# Patient Record
Sex: Female | Born: 2000
Health system: Southern US, Community
[De-identification: ages and names within clinical notes are randomized; demographics above are authoritative.]

## PROBLEM LIST (undated history)

## (undated) DIAGNOSIS — F419 Anxiety disorder, unspecified: Secondary | ICD-10-CM

## (undated) DIAGNOSIS — G43909 Migraine, unspecified, not intractable, without status migrainosus: Secondary | ICD-10-CM

## (undated) HISTORY — DX: Anxiety disorder, unspecified: F41.9

## (undated) HISTORY — DX: Migraine, unspecified, not intractable, without status migrainosus: G43.909

---
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
## 2018-02-21 DIAGNOSIS — L709 Acne, unspecified: Secondary | ICD-10-CM | POA: Diagnosis not present

## 2018-02-21 DIAGNOSIS — B070 Plantar wart: Secondary | ICD-10-CM | POA: Diagnosis not present

## 2018-03-14 ENCOUNTER — Ambulatory Visit: Payer: 59 | Admitting: Family Medicine

## 2018-03-14 ENCOUNTER — Encounter: Payer: Self-pay | Admitting: Family Medicine

## 2018-03-14 VITALS — BP 108/64 | HR 72 | Temp 98.4°F | Ht 62.0 in | Wt 102.2 lb

## 2018-03-14 DIAGNOSIS — R5383 Other fatigue: Secondary | ICD-10-CM

## 2018-03-14 DIAGNOSIS — Z13220 Encounter for screening for lipoid disorders: Secondary | ICD-10-CM | POA: Diagnosis not present

## 2018-03-14 DIAGNOSIS — F419 Anxiety disorder, unspecified: Secondary | ICD-10-CM | POA: Diagnosis not present

## 2018-03-14 LAB — CBC WITH DIFFERENTIAL/PLATELET
BASOS ABS: 0.1 10*3/uL (ref 0.0–0.1)
BASOS PCT: 1 % (ref 0.0–3.0)
EOS ABS: 0.2 10*3/uL (ref 0.0–0.7)
Eosinophils Relative: 3.8 % (ref 0.0–5.0)
HCT: 38.3 % (ref 36.0–49.0)
HEMOGLOBIN: 13.1 g/dL (ref 12.0–16.0)
Lymphocytes Relative: 40.2 % (ref 24.0–48.0)
Lymphs Abs: 2.2 10*3/uL (ref 0.7–4.0)
MCHC: 34.3 g/dL (ref 31.0–37.0)
MCV: 88.8 fl (ref 78.0–98.0)
MONO ABS: 0.5 10*3/uL (ref 0.1–1.0)
Monocytes Relative: 8.9 % (ref 3.0–12.0)
Neutro Abs: 2.5 10*3/uL (ref 1.4–7.7)
Neutrophils Relative %: 46.1 % (ref 43.0–71.0)
PLATELETS: 244 10*3/uL (ref 150.0–575.0)
RBC: 4.31 Mil/uL (ref 3.80–5.70)
RDW: 12 % (ref 11.4–15.5)
WBC: 5.4 10*3/uL (ref 4.5–13.5)

## 2018-03-14 LAB — COMPREHENSIVE METABOLIC PANEL
ALBUMIN: 4.3 g/dL (ref 3.5–5.2)
ALT: 10 U/L (ref 0–35)
AST: 13 U/L (ref 0–37)
Alkaline Phosphatase: 39 U/L — ABNORMAL LOW (ref 47–119)
BUN: 17 mg/dL (ref 6–23)
CO2: 25 meq/L (ref 19–32)
CREATININE: 0.62 mg/dL (ref 0.40–1.20)
Calcium: 9.2 mg/dL (ref 8.4–10.5)
Chloride: 106 mEq/L (ref 96–112)
GFR: 131.25 mL/min (ref >60.00)
Glucose, Bld: 93 mg/dL (ref 70–99)
Potassium: 4.2 mEq/L (ref 3.5–5.1)
SODIUM: 139 meq/L (ref 135–145)
Total Bilirubin: 0.3 mg/dL (ref 0.2–1.2)
Total Protein: 6.8 g/dL (ref 6.0–8.3)

## 2018-03-14 LAB — LIPID PANEL
CHOL/HDL RATIO: 2
Cholesterol: 145 mg/dL (ref 0–200)
HDL: 59.4 mg/dL (ref >39.00)
LDL Cholesterol: 66 mg/dL (ref 0–99)
NonHDL: 85.41
Triglycerides: 97 mg/dL (ref 0.0–149.0)
VLDL: 19.4 mg/dL (ref 0.0–40.0)

## 2018-03-14 LAB — TSH: TSH: 5.96 u[IU]/mL — ABNORMAL HIGH (ref 0.40–5.00)

## 2018-03-14 MED ORDER — NORGESTIM-ETH ESTRAD TRIPHASIC 0.18/0.215/0.25 MG-25 MCG PO TABS: 1.0000 | ORAL_TABLET | Freq: Every day | ORAL | 11 refills | Status: DC

## 2018-03-14 MED ORDER — ESCITALOPRAM OXALATE 10 MG PO TABS: 10.0000 mg | ORAL_TABLET | Freq: Every day | ORAL | 2 refills | Status: DC

## 2018-03-14 NOTE — Progress Notes (Signed)
Patricia Valdez DOB: 08-Sep-2000 Encounter date: 03/14/2018  This isa 17 y.o. female who presents to establish care. Chief Complaint  Patient presents with  . New Patient (Initial Visit)    fasting, mother states that patient seems to have insomnia and anxiety, has worsened over the past 6 months    History of present illness: Sleeping issues developed in last semester at previous college. Noted more with early morning class. Has always had some anxiety. Feels like it leads her to feeling down. Large family hx of anxiety. Feels like it affects her personal life, relationships. Gets overwhelmed. Stays up all night "stressing" and doesn't even know what she is stressing about. Easily triggered with stress. Grades are good although feels like she could do better without stress. Getting all A's and 1 B and gets frustrated by not getting all A's. Cries when she gets stressed. Wants to start exercising. Personally hasn't been treated for depression or anxiety, but whole family has been treated. She has had panic attacks in past. Not being as social. Majoring in business. Now in [place]. She isn't as comfortable being out there; worries about being outside. There is a gym she can exercise at.   Overthinking sleep now, so harder to fall asleep. Some nights staying up all night to study, then other nights not being able to fall asleep or stay asleep. Wakes frequently feeling stressed. Feels like she is a Lawyer. Not taking anything to help with sleep because worried about interaction with ocp.   Was dx with ADD as child; doesn't feel like she has this because can sit down and focus when she wants. Now feels harder. Just distracted by stress/worry.   Sometimes feels frustrated with fighting through these feelings daily.   Has had migraines entire life. Used to cause vomiting and then would go away. Now takes ibuprofen and it does go away. Rests in dark room - lasts 1-2 hours. Saw neurology as child  and did charting. Has frequent headaches that are affected by stress and lack of sleep. Migraines essentially stopped as child with good eating, rest. Tries not to take as much ibuprofen to prevent rebound headaches.   Past Medical History:  Diagnosis Date  . Anxiety   . Migraine    Past Surgical History:  Procedure Laterality Date  . WISDOM TOOTH EXTRACTION  2018   No Known Allergies Current Meds  Medication Sig  . tretinoin (RETIN-A) 0.025 % cream Apply topically at bedtime.   Social History   Tobacco Use  . Smoking status: Never Smoker  . Smokeless tobacco: Never Used  Substance Use Topics  . Alcohol use: No    Alcohol/week: 0.0 standard drinks   Family History  Problem Relation Age of Onset  . Arthritis Mother   . Depression Mother   . Diabetes Mother   . Hypertension Mother   . Hyperlipidemia Mother   . Arthritis Father   . Depression Father   . Hyperlipidemia Father   . Depression Sister   . Hypertension Sister   . Depression Maternal Grandmother   . Hyperlipidemia Maternal Grandmother   . Hypertension Maternal Grandmother   . Arthritis Maternal Grandfather   . Cancer Maternal Grandfather   . Depression Maternal Grandfather   . Hyperlipidemia Maternal Grandfather   . Hypertension Maternal Grandfather   . Arthritis Paternal Grandmother   . Cancer Paternal Grandmother   . Depression Paternal Grandmother   . Hyperlipidemia Paternal Grandmother   . Hypertension Paternal Grandmother   .  Arthritis Paternal Grandfather   . Cancer Paternal Grandfather   . Depression Sister      Review of Systems  Constitutional: Negative for chills, fatigue and fever.  Respiratory: Negative for cough, chest tightness, shortness of breath and wheezing.   Cardiovascular: Negative for chest pain, palpitations and leg swelling.  Neurological: Positive for headaches.  Psychiatric/Behavioral: Positive for decreased concentration and sleep disturbance. The patient is  nervous/anxious.     Objective:  BP 108/64 (BP Location: Left Arm, Patient Position: Sitting, Cuff Size: Normal)   Pulse 72   Temp 98.4 F (36.9 C) (Oral)   Ht 5\' 2"  (1.575 m)   Wt 102 lb 3.2 oz (46.4 kg)   SpO2 100%   BMI 18.69 kg/m   Weight: 102 lb 3.2 oz (46.4 kg)   BP Readings from Last 3 Encounters:  03/14/18 108/64  12/26/14 119/69 (85 %, Z = 1.04 /  67 %, Z = 0.43)*  11/20/14 96/64 (10 %, Z = -1.28 /  47 %, Z = -0.08)*   *BP percentiles are based on the 2017 AAP Clinical Practice Guideline for girls   Wt Readings from Last 3 Encounters:  03/14/18 102 lb 3.2 oz (46.4 kg) (5 %, Z= -1.60)*  12/26/14 106 lb 1 oz (48.1 kg) (22 %, Z= -0.78)*  11/20/14 102 lb (46.3 kg) (15 %, Z= -1.05)*   * Growth percentiles are based on CDC (Girls, 2-20 Years) data.    Physical Exam Constitutional:      General: She is not in acute distress.    Appearance: She is well-developed.  Cardiovascular:     Rate and Rhythm: Normal rate and regular rhythm.     Heart sounds: Normal heart sounds. No murmur. No friction rub.     Comments: No lower extremity edema Pulmonary:     Effort: Pulmonary effort is normal. No respiratory distress.     Breath sounds: Normal breath sounds. No wheezing or rales.  Neurological:     Mental Status: She is alert and oriented to person, place, and time.  Psychiatric:        Mood and Affect: Mood is anxious.        Behavior: Behavior normal. Behavior is cooperative.        Thought Content: Thought content does not include homicidal or suicidal ideation. Thought content does not include suicidal plan.     Comments: She is tearful on exam and discussing stress levels and how her anxiety affects her relationships.    Depression screen PHQ 2/9 03/14/2018  Decreased Interest 1  Down, Depressed, Hopeless 2  PHQ - 2 Score 3  Altered sleeping 3  Tired, decreased energy 3  Change in appetite 3  Feeling bad or failure about yourself  2  Trouble concentrating 3   Moving slowly or fidgety/restless 2  Suicidal thoughts 1  PHQ-9 Score 20  Difficult doing work/chores Very difficult     Assessment/Plan:  1. Anxiety Start medication; follow back up in 2-3 weeks.  - escitalopram (LEXAPRO) 10 MG tablet; Take 1 tablet (10 mg total) by mouth daily.  Dispense: 30 tablet; Refill: 2  2. Other fatigue Basic bloodwork. - Comprehensive metabolic panel; Future - CBC with Differential/Platelet; Future - TSH; Future  3. Lipid screening - Lipid panel; Future   Return in about 2 weeks (around 03/28/2018).  Theodis ShoveJunell Keeton Kassebaum, MD  Discussed new medication(s) today with patient. Discussed potential side effects and patient verbalized understanding.

## 2018-03-14 NOTE — Patient Instructions (Addendum)
Start lexapro daily. Let me know if any problems with medication. (ok to take 1/2 tablet to start if you would like)

## 2018-03-19 ENCOUNTER — Encounter: Payer: Self-pay | Admitting: Family Medicine

## 2018-03-23 NOTE — Telephone Encounter (Signed)
Can you please call her and get update since last message was from a few days ago? Has she continued to take the medication? Any more episodes like the one described? I agree it sounds like it could be a panic attack, but I would like an update before jumping to conclusions. Just get all the info you can so I know how to advise. Thanks!

## 2018-04-02 ENCOUNTER — Encounter: Payer: Self-pay | Admitting: Family Medicine

## 2018-04-02 ENCOUNTER — Ambulatory Visit: Payer: 59 | Admitting: Family Medicine

## 2018-04-02 VITALS — BP 110/60 | HR 80 | Temp 97.4°F | Wt 102.2 lb

## 2018-04-02 DIAGNOSIS — E039 Hypothyroidism, unspecified: Secondary | ICD-10-CM

## 2018-04-02 DIAGNOSIS — F419 Anxiety disorder, unspecified: Secondary | ICD-10-CM | POA: Diagnosis not present

## 2018-04-02 MED ORDER — ESCITALOPRAM OXALATE 10 MG PO TABS: 10.0000 mg | ORAL_TABLET | Freq: Every day | ORAL | 1 refills | Status: DC

## 2018-04-02 NOTE — Progress Notes (Signed)
Patricia Valdez DOB: Jul 01, 2000 Encounter date: 04/02/2018  This is a 18 y.o. female who presents with Chief Complaint  Patient presents with  . Follow-up    dizzy spells when starting medication, still has anxiety but not as bad, quick to anger    History of present illness: At last visit was started on lexapro for anxiety. TSH up to 5.96 and recommended recheck of this in future.  Feels happier overall, but feels that break from school may be helping. Dizziness on initially starting medication has gone away. Had anxiety attack one day after starting, but just one attack. Attack lasted for a minute but affected whole body. Has been taking full pill now and doing ok with this.   Goes back to school this coming weekend. Will be back home on spring break at least; maybe sooner.   Other day she really felt like she got much angrier than she should about something little boyfriend did. Crying and shaking with anger.   Mood has been happier. Per mom/father that she is more patient and mood is happier overall.     No Known Allergies Current Meds  Medication Sig  . escitalopram (LEXAPRO) 10 MG tablet Take 1 tablet (10 mg total) by mouth daily.  . Norgestimate-Ethinyl Estradiol Triphasic (TRI-LO-MARZIA) 0.18/0.215/0.25 MG-25 MCG tab Take 1 tablet by mouth daily.  Marland Kitchen tretinoin (RETIN-A) 0.025 % cream Apply topically at bedtime.  . [DISCONTINUED] escitalopram (LEXAPRO) 10 MG tablet Take 1 tablet (10 mg total) by mouth daily.    Review of Systems  Constitutional: Negative for chills, fatigue and fever.  Respiratory: Negative for cough, chest tightness, shortness of breath and wheezing.   Cardiovascular: Negative for chest pain, palpitations and leg swelling.  Psychiatric/Behavioral: Negative for agitation (not typically), sleep disturbance and suicidal ideas. The patient is not nervous/anxious (better).     Objective:  BP 110/60 (BP Location: Left Arm, Patient Position: Sitting, Cuff  Size: Normal)   Pulse 80   Temp (!) 97.4 F (36.3 C) (Oral)   Wt 102 lb 3.2 oz (46.4 kg)   SpO2 99%   BMI 18.69 kg/m   Weight: 102 lb 3.2 oz (46.4 kg)   BP Readings from Last 3 Encounters:  04/02/18 110/60  03/14/18 108/64  12/26/14 119/69 (85 %, Z = 1.04 /  67 %, Z = 0.43)*   *BP percentiles are based on the 2017 AAP Clinical Practice Guideline for girls   Wt Readings from Last 3 Encounters:  04/02/18 102 lb 3.2 oz (46.4 kg) (5 %, Z= -1.61)*  03/14/18 102 lb 3.2 oz (46.4 kg) (5 %, Z= -1.60)*  12/26/14 106 lb 1 oz (48.1 kg) (22 %, Z= -0.78)*   * Growth percentiles are based on CDC (Girls, 2-20 Years) data.    Physical Exam Constitutional:      General: She is not in acute distress.    Appearance: She is well-developed.  Cardiovascular:     Rate and Rhythm: Normal rate and regular rhythm.     Heart sounds: Normal heart sounds. No murmur. No friction rub.     Comments: No lower extremity edema Pulmonary:     Effort: Pulmonary effort is normal. No respiratory distress.     Breath sounds: Normal breath sounds. No wheezing or rales.  Neurological:     Mental Status: She is alert and oriented to person, place, and time.  Psychiatric:        Mood and Affect: Mood normal. Mood is not anxious or depressed.  Behavior: Behavior normal.     Comments: Mood has been better, anxiety has been better.   Sleep is still a little off - staying up late and sleeping in more than school.      Assessment/Plan 1. Hypothyroidism, unspecified type Will recheck at future visit or at school break. Reviewed bloodwork together. - T3, free - T4, free; Future - TSH; Future  2. Anxiety Continue with lexapro at current dose.   Return in about 3 months (around 07/02/2018) for Chronic condition visit.     Theodis Shove, MD

## 2018-05-28 ENCOUNTER — Encounter: Payer: Self-pay | Admitting: Family Medicine

## 2018-05-28 NOTE — Telephone Encounter (Signed)
Please call patient and fit her in for appointment 30 min next week. Thanks!

## 2018-06-05 NOTE — Progress Notes (Signed)
Patricia Valdez DOB: Mar 25, 2001 Encounter date: 06/06/2018  This is a 18 y.o. female who presents with Chief Complaint  Patient presents with  . Medication Management    lexapro causing migrains, ub=nable to do school work    History of present illness: Lexapro previously started for anxiety and irritability; but patient contacted me last week regarding frequent headaches that she feels may be related to medication.   Noticed it right away, but then it got worse. Stopped taking medication because she was getting headaches every day. Stopped medication a week ago after tapering down to half tablet and headaches are gone now.   Did feel like medication helped her with mood - much happier. Also felt like stress level was so much better. Less stress in head. Has noticed that sx are much worse without it.    Lexapro was selected after discussion of family hx. Sister and mom were on prozac. One sister had too much emotional blunting on prozac. Sevi feels that she overthinks everything.   Grades are good. 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 She is really happy about this. 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 There is limited entry program. Then it is 2.5 year program.   No Known Allergies Current Meds  Medication Sig  . Norgestimate-Ethinyl Estradiol Triphasic (TRI-LO-MARZIA) 0.18/0.215/0.25 MG-25 MCG tab Take 1 tablet by mouth daily.  Marland Kitchen tretinoin (RETIN-A) 0.025 % cream Apply topically at bedtime.  . [DISCONTINUED] escitalopram (LEXAPRO) 10 MG tablet Take 1 tablet (10 mg total) by mouth daily.    Review of Systems  Constitutional: Negative for activity change (has been exercising some; planning on continuing to work on this) and appetite change (eating much better).  Psychiatric/Behavioral: Negative for agitation, decreased concentration, sleep disturbance (sleeping much better since starting the lexapro) and suicidal ideas. The patient is  nervous/anxious (improved on medication).     Objective:  BP 110/60 (BP Location: Left Arm, Patient Position: Sitting, Cuff Size: Normal)   Pulse 73   Temp 98.1 F (36.7 C) (Oral)   Wt 105 lb (47.6 kg)   SpO2 98%   BMI 19.20 kg/m   Weight: 105 lb (47.6 kg)   BP Readings from Last 3 Encounters:  06/06/18 110/60  04/02/18 110/60  03/14/18 108/64   Wt Readings from Last 3 Encounters:  06/06/18 105 lb (47.6 kg) (8 %, Z= -1.39)*  04/02/18 102 lb 3.2 oz (46.4 kg) (5 %, Z= -1.61)*  03/14/18 102 lb 3.2 oz (46.4 kg) (5 %, Z= -1.60)*   * Growth percentiles are based on CDC (Girls, 2-20 Years) data.    Physical Exam Constitutional:      General: She is not in acute distress.    Appearance: She is well-developed.  Cardiovascular:     Rate and Rhythm: Normal rate and regular rhythm.     Heart sounds: Normal heart sounds. No murmur. No friction rub.  Pulmonary:     Effort: Pulmonary effort is normal. No respiratory distress.     Breath sounds: Normal breath sounds. No wheezing or rales.  Musculoskeletal:     Right lower leg: No edema.     Left lower leg: No edema.  Neurological:     Mental Status: She is alert and oriented to person, place, and time.  Psychiatric:        Attention and Perception: Attention and perception normal.        Mood and Affect: Mood and affect normal.  Behavior: Behavior normal.    Depression screen [facility] 2/9 06/06/2018 03/14/2018  Decreased Interest 1 1  Down, Depressed, Hopeless 1 2  PHQ - 2 Score 2 3  Altered sleeping 0 3  Tired, decreased energy 0 3  Change in appetite 0 3  Feeling bad or failure about yourself  1 2  Trouble concentrating 1 3  Moving slowly or fidgety/restless 0 2  Suicidal thoughts 0 1  PHQ-9 Score 4 20  Difficult doing work/chores Not difficult at all Very difficult    Assessment/Plan 1. Anxiety She has had a notable improvement on SSRI therapy, but is having headache side effect of Lexapro.  She would like to try  Prozac and we will started at 20 mg daily dose.  Since she is traveling back to school, I have asked her to update me through my chart in 2 weeks time.  She will follow back up in the summer once school has ended.  Of note her PHQ score has improved from 20 to 4 with the addition of Lexapro.  I suspect that depressed symptoms were more related to sequelae of anxiety. - FLUoxetine (PROZAC) 20 MG tablet; Take 1 tablet (20 mg total) by mouth daily.  Dispense: 30 tablet; Refill: 2  Return in about 3 months (around 09/06/2018) for physical exam.      Theodis Shove, MD

## 2018-06-06 ENCOUNTER — Other Ambulatory Visit: Payer: Self-pay | Admitting: Family Medicine

## 2018-06-06 ENCOUNTER — Encounter: Payer: Self-pay | Admitting: Family Medicine

## 2018-06-06 ENCOUNTER — Other Ambulatory Visit: Payer: Self-pay

## 2018-06-06 ENCOUNTER — Ambulatory Visit: Payer: 59 | Admitting: Family Medicine

## 2018-06-06 VITALS — BP 110/60 | HR 73 | Temp 98.1°F | Wt 105.0 lb

## 2018-06-06 DIAGNOSIS — F419 Anxiety disorder, unspecified: Secondary | ICD-10-CM

## 2018-06-06 MED ORDER — FLUOXETINE HCL 20 MG PO TABS: 20.0000 mg | ORAL_TABLET | Freq: Every day | ORAL | 2 refills | Status: DC

## 2018-06-06 NOTE — Patient Instructions (Signed)
Update me in 2-3 weeks through mychart with how you are doing on prozac and sooner if any side effects.

## 2018-06-08 ENCOUNTER — Other Ambulatory Visit: Payer: Self-pay

## 2018-06-08 DIAGNOSIS — F419 Anxiety disorder, unspecified: Secondary | ICD-10-CM

## 2018-06-08 MED ORDER — FLUOXETINE HCL 20 MG PO CAPS: 20.0000 mg | ORAL_CAPSULE | Freq: Every day | ORAL | 1 refills | Status: DC

## 2018-06-08 NOTE — Telephone Encounter (Signed)
That's weird! Should be 1 capsule daily. Thanks!

## 2018-06-08 NOTE — Telephone Encounter (Signed)
received a fax from the pharmacy stating there is no sig on the RX. Please advise, what are directions for use?

## 2018-06-08 NOTE — Telephone Encounter (Signed)
I have resent with sig

## 2018-09-07 ENCOUNTER — Encounter: Payer: 59 | Admitting: Family Medicine

## 2018-09-19 ENCOUNTER — Other Ambulatory Visit: Payer: Self-pay | Admitting: Family Medicine

## 2018-09-19 DIAGNOSIS — F419 Anxiety disorder, unspecified: Secondary | ICD-10-CM

## 2018-09-22 ENCOUNTER — Encounter: Payer: Self-pay | Admitting: Family Medicine

## 2018-10-22 ENCOUNTER — Other Ambulatory Visit: Payer: Self-pay | Admitting: *Deleted

## 2018-10-22 DIAGNOSIS — F419 Anxiety disorder, unspecified: Secondary | ICD-10-CM

## 2018-10-22 MED ORDER — FLUOXETINE HCL 20 MG PO CAPS: ORAL_CAPSULE | ORAL | 0 refills | Status: DC

## 2018-10-22 NOTE — Telephone Encounter (Signed)
Rx done. 

## 2018-11-22 ENCOUNTER — Encounter: Payer: Self-pay | Admitting: Family Medicine

## 2018-12-28 ENCOUNTER — Ambulatory Visit (INDEPENDENT_AMBULATORY_CARE_PROVIDER_SITE_OTHER): Payer: 59 | Admitting: Family Medicine

## 2018-12-28 ENCOUNTER — Encounter: Payer: Self-pay | Admitting: Family Medicine

## 2018-12-28 ENCOUNTER — Other Ambulatory Visit: Payer: Self-pay

## 2018-12-28 DIAGNOSIS — E039 Hypothyroidism, unspecified: Secondary | ICD-10-CM | POA: Diagnosis not present

## 2018-12-28 DIAGNOSIS — F419 Anxiety disorder, unspecified: Secondary | ICD-10-CM | POA: Diagnosis not present

## 2018-12-28 MED ORDER — FLUOXETINE HCL 20 MG PO CAPS: ORAL_CAPSULE | ORAL | 1 refills | Status: DC

## 2018-12-28 NOTE — Progress Notes (Signed)
Virtual Visit via Video Note  I connected with Patricia Valdez  on 12/29/18 at  3:00 PM EDT by a video enabled telemedicine application and verified that I am speaking with the correct person using two identifiers.  Location patient: home Location provider:work or home office Persons participating in the virtual visit: patient, provider  I discussed the limitations of evaluation and management by telemedicine and the availability of in person appointments. The patient expressed understanding and agreed to proceed.   Patricia Valdez DOB: Jul 07, 2000 Encounter date: 12/28/2018  This is a 18 y.o. female who presents with Chief Complaint  Patient presents with  . Follow-up    requests to discuss thyroid and Prozac    History of present illness: Doing great with prozac dose. Everyone around her states that she is doing fantastic. Boyfriend and her broke up. He was controlling and bad for mental health, so this has been a really positive change. 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   She is very pleased with how she is tolerating medication and now makes her feel.  States she feels like a different person.  She was wondering about follow-up for her borderline hypothyroid that was noted on previous blood work.  Allergies  Allergen Reactions  . Other     Cold medicines cause dizziness   Current Meds  Medication Sig  . FLUoxetine (PROZAC) 20 MG capsule TAKE 1 CAPSULE BY MOUTH EVERY DAY  . Norgestimate-Ethinyl Estradiol Triphasic (TRI-LO-MARZIA) 0.18/0.215/0.25 MG-25 MCG tab Take 1 tablet by mouth daily.  Marland Kitchen tretinoin (RETIN-A) 0.025 % cream Apply topically at bedtime.  . [DISCONTINUED] FLUoxetine (PROZAC) 20 MG capsule TAKE 1 CAPSULE BY MOUTH EVERY DAY    Review of Systems  Constitutional: Negative for chills, fatigue and fever.  Respiratory: Negative for cough, chest tightness, shortness of breath and  wheezing.   Cardiovascular: Negative for chest pain, palpitations and leg swelling.  Psychiatric/Behavioral: Negative for agitation, decreased concentration and sleep disturbance. The patient is not nervous/anxious.     Objective:  LMP 12/02/2018 (Approximate)       BP Readings from Last 3 Encounters:  06/06/18 110/60  04/02/18 110/60  03/14/18 108/64   Wt Readings from Last 3 Encounters:  06/06/18 105 lb (47.6 kg) (8 %, Z= -1.39)*  04/02/18 102 lb 3.2 oz (46.4 kg) (5 %, Z= -1.61)*  03/14/18 102 lb 3.2 oz (46.4 kg) (5 %, Z= -1.60)*   * Growth percentiles are based on CDC (Girls, 2-20 Years) data.    EXAM:  GENERAL: alert, oriented, appears well and in no acute distress  HEENT: atraumatic, conjunctiva clear, no obvious abnormalities on inspection of external nose and ears  NECK: normal movements of the head and neck  LUNGS: on inspection no signs of respiratory distress, breathing rate appears normal, no obvious gross SOB, gasping or wheezing  CV: no obvious cyanosis  MS: moves all visible extremities without noticeable abnormality  PSYCH/NEURO: pleasant and cooperative, no obvious depression or anxiety, speech and thought processing grossly intact   Assessment/Plan 1. Anxiety She is doing great with the Prozac.  Continue her current 20 mg dose. - FLUoxetine (PROZAC) 20 MG capsule; TAKE 1 CAPSULE BY MOUTH EVERY DAY  Dispense: 90 capsule; Refill: 1  2. Hypothyroidism, unspecified type We will recheck her blood work when she is here for her next visit.  Return for physical exam in 2-3 months.     I  discussed the assessment and treatment plan with the patient. The patient was provided an opportunity to ask questions and all were answered. The patient agreed with the plan and demonstrated an understanding of the instructions.   The patient was advised to call back or seek an in-person evaluation if the symptoms worsen or if the condition fails to improve as  anticipated.  I provided 15 minutes of non-face-to-face time during this encounter.   Micheline Rough, MD

## 2019-01-01 ENCOUNTER — Telehealth: Payer: Self-pay | Admitting: *Deleted

## 2019-01-01 NOTE — Telephone Encounter (Signed)
-----   Message from Caren Macadam, MD sent at 12/28/2018  3:28 PM EDT ----- Schedule physical exam in next few months

## 2019-01-01 NOTE — Telephone Encounter (Signed)
Left a detailed message for the pt to call for an appt as below. 

## 2019-01-22 ENCOUNTER — Encounter: Payer: Self-pay | Admitting: Family Medicine

## 2019-01-22 ENCOUNTER — Other Ambulatory Visit: Payer: Self-pay

## 2019-01-22 ENCOUNTER — Ambulatory Visit (INDEPENDENT_AMBULATORY_CARE_PROVIDER_SITE_OTHER): Payer: 59

## 2019-01-22 DIAGNOSIS — Z23 Encounter for immunization: Secondary | ICD-10-CM | POA: Diagnosis not present

## 2019-01-30 ENCOUNTER — Ambulatory Visit: Payer: 59 | Admitting: Family Medicine

## 2019-04-12 ENCOUNTER — Encounter: Payer: 59 | Admitting: Family Medicine

## 2019-04-18 ENCOUNTER — Other Ambulatory Visit: Payer: Self-pay

## 2019-04-18 ENCOUNTER — Ambulatory Visit: Payer: 59 | Attending: Internal Medicine

## 2019-04-18 DIAGNOSIS — Z20822 Contact with and (suspected) exposure to covid-19: Secondary | ICD-10-CM

## 2019-04-19 LAB — NOVEL CORONAVIRUS, NAA: SARS-CoV-2, NAA: NOT DETECTED

## 2019-06-06 ENCOUNTER — Other Ambulatory Visit: Payer: Self-pay

## 2019-06-06 ENCOUNTER — Ambulatory Visit: Payer: 59 | Attending: Internal Medicine

## 2019-06-06 DIAGNOSIS — Z20822 Contact with and (suspected) exposure to covid-19: Secondary | ICD-10-CM

## 2019-06-07 LAB — NOVEL CORONAVIRUS, NAA: SARS-CoV-2, NAA: DETECTED — AB

## 2019-06-14 ENCOUNTER — Encounter: Payer: 59 | Admitting: Family Medicine

## 2019-07-17 ENCOUNTER — Other Ambulatory Visit: Payer: Self-pay | Admitting: Family Medicine

## 2019-07-17 DIAGNOSIS — F419 Anxiety disorder, unspecified: Secondary | ICD-10-CM

## 2019-07-26 ENCOUNTER — Encounter: Payer: Self-pay | Admitting: Family Medicine

## 2019-07-29 ENCOUNTER — Telehealth: Payer: Self-pay | Admitting: *Deleted

## 2019-07-30 ENCOUNTER — Other Ambulatory Visit: Payer: Self-pay

## 2019-07-31 ENCOUNTER — Ambulatory Visit (INDEPENDENT_AMBULATORY_CARE_PROVIDER_SITE_OTHER): Payer: 59 | Admitting: Family Medicine

## 2019-07-31 ENCOUNTER — Encounter: Payer: Self-pay | Admitting: Family Medicine

## 2019-07-31 VITALS — BP 110/70 | HR 65 | Temp 98.3°F | Ht 62.0 in | Wt 101.9 lb

## 2019-07-31 DIAGNOSIS — K2960 Other gastritis without bleeding: Secondary | ICD-10-CM

## 2019-07-31 MED ORDER — OMEPRAZOLE 20 MG PO CPDR: 20.0000 mg | DELAYED_RELEASE_CAPSULE | Freq: Every day | ORAL | 2 refills | Status: DC

## 2019-07-31 NOTE — Patient Instructions (Addendum)
Update me if you are not feeling better within a couple of weeks.    Gastritis, Adult Gastritis is inflammation of the stomach. There are two kinds of gastritis:  Acute gastritis. This kind develops suddenly.  Chronic gastritis. This kind is much more common and lasts for a long time. Gastritis happens when the lining of the stomach becomes weak or gets damaged. Without treatment, gastritis can lead to stomach bleeding and ulcers. What are the causes? This condition may be caused by:  An infection.  Drinking too much alcohol.  Certain medicines. These include steroids, antibiotics, and some over-the-counter medicines, such as aspirin or ibuprofen.  Having too much acid in the stomach.  A disease of the intestines or stomach.  Stress.  An allergic reaction.  Crohn's disease.  Some cancer treatments (radiation). Sometimes the cause of this condition is not known. What are the signs or symptoms? Symptoms of this condition include:  Pain or a burning sensation in the upper abdomen.  Nausea.  Vomiting.  An uncomfortable feeling of fullness after eating.  Weight loss.  Bad breath.  Blood in your vomit or stools. In some cases, there are no symptoms. How is this diagnosed? This condition may be diagnosed with:  Your medical history and a description of your symptoms.  A physical exam.  Tests. These can include: ? Blood tests. ? Stool tests. ? A test in which a thin, flexible instrument with a light and a camera is passed down the esophagus and into the stomach (upper endoscopy). ? A test in which a sample of tissue is taken for testing (biopsy). How is this treated? This condition may be treated with medicines. The medicines that are used vary depending on the cause of the gastritis:  If the condition is caused by a bacterial infection, you may be given antibiotic medicines.  If the condition is caused by too much acid in the stomach, you may be given  medicines called H2 blockers, proton pump inhibitors, or antacids. Treatment may also involve stopping the use of certain medicines, such as aspirin, ibuprofen, or other NSAIDs. Follow these instructions at home: Medicines  Take over-the-counter and prescription medicines only as told by your health care provider.  If you were prescribed an antibiotic medicine, take it as told by your health care provider. Do not stop taking the antibiotic even if you start to feel better. Eating and drinking   Eat small, frequent meals instead of large meals.  Avoid foods and drinks that make your symptoms worse.  Drink enough fluid to keep your urine pale yellow. Alcohol use  Do not drink alcohol if: ? Your health care provider tells you not to drink. ? You are pregnant, may be pregnant, or are planning to become pregnant.  If you drink alcohol: ? Limit your use to:  0-1 drink a day for women.  0-2 drinks a day for men. ? Be aware of how much alcohol is in your drink. In the [place] one drink equals one 12 oz bottle of beer (355 mL), one 5 oz glass of wine (148 mL), or one 1 oz glass of hard liquor (44 mL). General instructions  Talk with your health care provider about ways to manage stress, such as getting regular exercise or practicing deep breathing, meditation, or yoga.  Do not use any products that contain nicotine or tobacco, such as cigarettes and e-cigarettes. If you need help quitting, ask your health care provider.  Keep all follow-up visits as told  by your health care provider. This is important. Contact a health care provider if:  Your symptoms get worse.  Your symptoms return after treatment. Get help right away if:  You vomit blood or material that looks like coffee grounds.  You have black or dark red stools.  You are unable to keep fluids down.  Your abdominal pain gets worse.  You have a fever.  You do not feel better after one week. Summary  Gastritis is  inflammation of the lining of the stomach that can occur suddenly (acute) or develop slowly over time (chronic).  This condition is diagnosed with a medical history, a physical exam, or tests.  This condition may be treated with medicines to treat infection or medicines to reduce the amount of acid in your stomach.  Follow your health care provider's instructions about taking medicines, making changes to your diet, and knowing when to call for help. This information is not intended to replace advice given to you by your health care provider. Make sure you discuss any questions you have with your health care provider. Document Revised: 08/01/2017 Document Reviewed: 08/01/2017 Elsevier Patient Education  [employer].  Gastroesophageal Reflux Scan A gastroesophageal reflux scan is a procedure that checks for the backward flow of stomach contents into the esophagus (gastroesophageal reflux). The esophagus is the tube that carries food from your mouth to your stomach. The scan can also show if any stomach contents have been inhaled (aspirated) into your lungs. You may need this scan if you have symptoms such as heartburn, vomiting, swallowing problems, or regurgitation. Regurgitation means that swallowed food is returning from the stomach to the esophagus. For this scan, you will drink a liquid that contains a small amount of a radioactive substance (tracer). A scanner with a camera that detects the tracer will show if any of the material flows backward into your esophagus. Tell a health care provider about:  Any allergies you have.  All medicines you are taking, including vitamins, herbs, eye drops, creams, and over-the-counter medicines.  Any blood disorders you have.  Any surgeries you have had.  Any medical conditions you have.  Whether you are pregnant or may be pregnant.  Whether you are breastfeeding. What are the risks? Generally, this is a safe procedure. However, problems  may occur, including:  Exposure to radiation (a small amount of it).  Allergic reaction to the radioactive substance. This is rare, but it may include swelling of the tongue and difficulty breathing. What happens before the procedure?  Ask your health care provider about changing or stopping your regular medicines.  Follow instructions from your health care provider about eating or drinking restrictions. What happens during the procedure?   You will drink a liquid that contains a small amount of a radioactive tracer. This liquid will probably be similar to orange juice.  You will lie on your back.  A series of images will be taken of your esophagus and upper stomach.  You may be asked to move into different positions to help check whether reflux occurs more often when you are in specific positions.  For adults, an abdominal binder with an inflatable cuff may be placed on the abdomen. This may be used to increase abdominal pressure. More images will be taken to see if the increased pressure causes reflux to occur. The procedure may vary among health care providers and hospitals. What can I expect after the procedure?   Return to your normal activities and diet as  told by your health care provider.  The radioactive tracer will leave your body over the next few days. Drink enough fluid to keep your urine pale yellow. This will help to flush the tracer out of your body.  It is up to you to get the results of your procedure. Ask your health care provider, or the department that is doing the procedure, when and how you will get your results. Summary  A gastroesophageal reflux scan is a procedure that checks for the backward flow of stomach contents into the esophagus (gastroesophageal reflux).  You may need this scan if you have symptoms such as heartburn, vomiting, swallowing problems, or regurgitation.  Before the procedure, follow your health care provider's instructions about  eating or drinking restrictions and whether you should take your regular medicines.  For the procedure, you will drink a liquid that contains a small amount of a radioactive substance (tracer). A scanner with a camera that detects the tracer will show if any of the material flows backward into your esophagus. This information is not intended to replace advice given to you by your health care provider. Make sure you discuss any questions you have with your health care provider. Document Revised: 02/24/2017 Document Reviewed: 02/22/2017 Elsevier Patient Education  2020 [employer]  Gastroesophageal Reflux Disease, Adult Gastroesophageal reflux (GER) happens when acid from the stomach flows up into the tube that connects the mouth and the stomach (esophagus). Normally, food travels down the esophagus and stays in the stomach to be digested. However, when a person has GER, food and stomach acid sometimes move back up into the esophagus. If this becomes a more serious problem, the person may be diagnosed with a disease called gastroesophageal reflux disease (GERD). GERD occurs when the reflux:  Happens often.  Causes frequent or severe symptoms.  Causes problems such as damage to the esophagus. When stomach acid comes in contact with the esophagus, the acid may cause soreness (inflammation) in the esophagus. Over time, GERD may create small holes (ulcers) in the lining of the esophagus. What are the causes? This condition is caused by a problem with the muscle between the esophagus and the stomach (lower esophageal sphincter, or LES). Normally, the LES muscle closes after food passes through the esophagus to the stomach. When the LES is weakened or abnormal, it does not close properly, and that allows food and stomach acid to go back up into the esophagus. The LES can be weakened by certain dietary substances, medicines, and medical conditions, including:  Tobacco use.  Pregnancy.  Having a  hiatal hernia.  Alcohol use.  Certain foods and beverages, such as coffee, chocolate, onions, and peppermint. What increases the risk? You are more likely to develop this condition if you:  Have an increased body weight.  Have a connective tissue disorder.  Use NSAID medicines. What are the signs or symptoms? Symptoms of this condition include:  Heartburn.  Difficult or painful swallowing.  The feeling of having a lump in the throat.  Abitter taste in the mouth.  Bad breath.  Having a large amount of saliva.  Having an upset or bloated stomach.  Belching.  Chest pain. Different conditions can cause chest pain. Make sure you see your health care provider if you experience chest pain.  Shortness of breath or wheezing.  Ongoing (chronic) cough or a night-time cough.  Wearing away of tooth enamel.  Weight loss. How is this diagnosed? Your health care provider will take a medical history and  perform a physical exam. To determine if you have mild or severe GERD, your health care provider may also monitor how you respond to treatment. You may also have tests, including:  A test to examine your stomach and esophagus with a small camera (endoscopy).  A test thatmeasures the acidity level in your esophagus.  A test thatmeasures how much pressure is on your esophagus.  A barium swallow or modified barium swallow test to show the shape, size, and functioning of your esophagus. How is this treated? The goal of treatment is to help relieve your symptoms and to prevent complications. Treatment for this condition may vary depending on how severe your symptoms are. Your health care provider may recommend:  Changes to your diet.  Medicine.  Surgery. Follow these instructions at home: Eating and drinking   Follow a diet as recommended by your health care provider. This may involve avoiding foods and drinks such as: ? Coffee and tea (with or without caffeine). ? Drinks  that containalcohol. ? Energy drinks and sports drinks. ? Carbonated drinks or sodas. ? Chocolate and cocoa. ? Peppermint and mint flavorings. ? Garlic and onions. ? Horseradish. ? Spicy and acidic foods, including peppers, chili powder, curry powder, vinegar, hot sauces, and barbecue sauce. ? Citrus fruit juices and citrus fruits, such as oranges, lemons, and limes. ? Tomato-based foods, such as red sauce, chili, salsa, and pizza with red sauce. ? Fried and fatty foods, such as donuts, french fries, potato chips, and high-fat dressings. ? High-fat meats, such as hot dogs and fatty cuts of red and white meats, such as rib eye steak, sausage, ham, and bacon. ? High-fat dairy items, such as whole milk, butter, and cream cheese.  Eat small, frequent meals instead of large meals.  Avoid drinking large amounts of liquid with your meals.  Avoid eating meals during the 2-3 hours before bedtime.  Avoid lying down right after you eat.  Do not exercise right after you eat. Lifestyle   Do not use any products that contain nicotine or tobacco, such as cigarettes, e-cigarettes, and chewing tobacco. If you need help quitting, ask your health care provider.  Try to reduce your stress by using methods such as yoga or meditation. If you need help reducing stress, ask your health care provider.  If you are overweight, reduce your weight to an amount that is healthy for you. Ask your health care provider for guidance about a safe weight loss goal. General instructions  Pay attention to any changes in your symptoms.  Take over-the-counter and prescription medicines only as told by your health care provider. Do not take aspirin, ibuprofen, or other NSAIDs unless your health care provider told you to do so.  Wear loose-fitting clothing. Do not wear anything tight around your waist that causes pressure on your abdomen.  Raise (elevate) the head of your bed about 6 inches (15 cm).  Avoid bending  over if this makes your symptoms worse.  Keep all follow-up visits as told by your health care provider. This is important. Contact a health care provider if:  You have: ? New symptoms. ? Unexplained weight loss. ? Difficulty swallowing or it hurts to swallow. ? Wheezing or a persistent cough. ? A hoarse voice.  Your symptoms do not improve with treatment. Get help right away if you:  Have pain in your arms, neck, jaw, teeth, or back.  Feel sweaty, dizzy, or light-headed.  Have chest pain or shortness of breath.  Vomit and your  vomit looks like blood or coffee grounds.  Faint.  Have stool that is bloody or black.  Cannot swallow, drink, or eat. Summary  Gastroesophageal reflux happens when acid from the stomach flows up into the esophagus. GERD is a disease in which the reflux happens often, causes frequent or severe symptoms, or causes problems such as damage to the esophagus.  Treatment for this condition may vary depending on how severe your symptoms are. Your health care provider may recommend diet and lifestyle changes, medicine, or surgery.  Contact a health care provider if you have new or worsening symptoms.  Take over-the-counter and prescription medicines only as told by your health care provider. Do not take aspirin, ibuprofen, or other NSAIDs unless your health care provider told you to do so.  Keep all follow-up visits as told by your health care provider. This is important. This information is not intended to replace advice given to you by your health care provider. Make sure you discuss any questions you have with your health care provider. Document Revised: 09/20/2017 Document Reviewed: 09/20/2017 Elsevier Patient Education  2020 [employer].

## 2019-07-31 NOTE — Progress Notes (Signed)
Patricia Valdez DOB: Aug 05, 2000 Encounter date: 07/31/2019  This is a 19 y.o. female who presents with Chief Complaint  Patient presents with  . Abdominal Pain    x2 months, burning sensation in her throat, suspected reflux, chest pain noted at times especially when eating, denies nausea or vomiting    History of present illness: Just one episode of chest pain. Stabbing pains in stomach after eating food.   Worries about allergy to food or gluten intolerance. Has had issues for last year, but noted more in last couple of months. Every time even just drinking water - feels bloated or different with stomach.   Has had some burping. Burps more when she is hurting.   Tends to come around more at night.   Has tried mylanta which helps. Tums helps as well.    Allergies  Allergen Reactions  . Other     Cold medicines cause dizziness   Current Meds  Medication Sig  . FLUoxetine (PROZAC) 20 MG capsule TAKE 1 CAPSULE BY MOUTH EVERY DAY  . Norgestimate-Ethinyl Estradiol Triphasic (TRI-LO-MARZIA) 0.18/0.215/0.25 MG-25 MCG tab Take 1 tablet by mouth daily.  Marland Kitchen tretinoin (RETIN-A) 0.025 % cream Apply topically at bedtime.    Review of Systems  Constitutional: Negative for chills, fatigue and fever.  Respiratory: Negative for cough, chest tightness, shortness of breath and wheezing.   Cardiovascular: Negative for chest pain, palpitations and leg swelling.  Gastrointestinal: Positive for abdominal pain and diarrhea. Negative for blood in stool, constipation, nausea and vomiting.  Genitourinary: Negative for dysuria.    Objective:  BP 110/70 (BP Location: Left Arm, Patient Position: Sitting, Cuff Size: Normal)   Pulse 65   Temp 98.3 F (36.8 C) (Temporal)   Ht 5\' 2"  (1.575 m)   Wt 101 lb 14.4 oz (46.2 kg)   LMP 07/15/2019 (Approximate)   BMI 18.64 kg/m   Weight: 101 lb 14.4 oz (46.2 kg)   BP Readings from Last 3 Encounters:  07/31/19 110/70  06/06/18 110/60  04/02/18 110/60    Wt Readings from Last 3 Encounters:  07/31/19 101 lb 14.4 oz (46.2 kg)  06/06/18 105 lb (47.6 kg) (8 %, Z= -1.39)*  04/02/18 102 lb 3.2 oz (46.4 kg) (5 %, Z= -1.61)*   * Growth percentiles are based on CDC (Girls, 2-20 Years) data.    Physical Exam Constitutional:      General: She is not in acute distress.    Appearance: She is well-developed.  Cardiovascular:     Rate and Rhythm: Normal rate and regular rhythm.     Heart sounds: Normal heart sounds. No murmur. No friction rub.  Pulmonary:     Effort: Pulmonary effort is normal. No respiratory distress.     Breath sounds: Normal breath sounds. No wheezing or rales.  Abdominal:     General: Abdomen is flat. Bowel sounds are normal.     Palpations: Abdomen is soft.     Tenderness: There is no abdominal tenderness. There is no right CVA tenderness or left CVA tenderness.  Musculoskeletal:     Right lower leg: No edema.     Left lower leg: No edema.  Neurological:     Mental Status: She is alert and oriented to person, place, and time.  Psychiatric:        Behavior: Behavior normal.     Assessment/Plan  1. Reflux gastritis Strongly suspect acid reflux due to history and benign exam findings.  Additionally, patient really enjoys foods that trigger reflux (  like spicy foods).  We discussed triggers to limit in the diet, we discussed having smaller meals and discussed importance of not lying down directly after meal.  We are going to have her try omeprazole for a month and see if this does resolve her symptoms.  I have asked her to let me know if she is not having any improvement within 2 weeks time.  Consider further evaluation or treatment at that time. - omeprazole (PRILOSEC) 20 MG capsule; Take 1 capsule (20 mg total) by mouth daily.  Dispense: 30 capsule; Refill: 2   Return if symptoms worsen or fail to improve. 25 minutes was spent discussing with patient treatment options for acid, dietary recommendations to help with  symptoms, potential follow-up or additional work-up, as needed treatments to use at home, exam, and charting.   Theodis Shove, MD

## 2019-08-13 ENCOUNTER — Encounter: Payer: Self-pay | Admitting: Family Medicine

## 2019-08-19 ENCOUNTER — Ambulatory Visit: Payer: 59 | Admitting: Family Medicine

## 2019-08-23 ENCOUNTER — Ambulatory Visit: Payer: 59 | Admitting: Family Medicine

## 2019-08-23 ENCOUNTER — Encounter: Payer: Self-pay | Admitting: Family Medicine

## 2019-08-23 DIAGNOSIS — Z0289 Encounter for other administrative examinations: Secondary | ICD-10-CM

## 2019-08-23 NOTE — Telephone Encounter (Signed)
Can you reschedule visit please

## 2019-08-27 NOTE — Telephone Encounter (Signed)
Spoke with the pt and scheduled an appt for 6/9 to arrive at 1:45pm.

## 2019-09-02 ENCOUNTER — Ambulatory Visit: Payer: 59 | Admitting: Family Medicine

## 2019-09-03 ENCOUNTER — Other Ambulatory Visit: Payer: Self-pay

## 2019-09-04 ENCOUNTER — Telehealth (INDEPENDENT_AMBULATORY_CARE_PROVIDER_SITE_OTHER): Payer: 59 | Admitting: Family Medicine

## 2019-09-04 DIAGNOSIS — F419 Anxiety disorder, unspecified: Secondary | ICD-10-CM | POA: Diagnosis not present

## 2019-09-04 DIAGNOSIS — R4589 Other symptoms and signs involving emotional state: Secondary | ICD-10-CM

## 2019-09-04 MED ORDER — FLUOXETINE HCL 10 MG PO CAPS: 10.0000 mg | ORAL_CAPSULE | Freq: Every day | ORAL | 1 refills | Status: DC

## 2019-09-04 NOTE — Progress Notes (Signed)
Virtual Visit via Video Note  I connected with Patricia Valdez  on 09/04/19 at  2:00 PM EDT by a video enabled telemedicine application and verified that I am speaking with the correct person using two identifiers.  Location patient: home Location provider:work or home office Persons participating in the virtual visit: patient, provider  I discussed the limitations of evaluation and management by telemedicine and the availability of in person appointments. The patient expressed understanding and agreed to proceed.   Patricia Valdez DOB: 2001/01/14 Encounter date: 09/04/2019  This is a 19 y.o. female who presents with No chief complaint on file.   History of present illness: When she had emailed about prozac she was just stuck in really bad mind set. She actually doesn't think she needs to go up on dose; she isn't sure. Goes into bad spells when she has harder things going on in life. Was in bad relationship and feels better since getting out of this.    Allergies  Allergen Reactions  . Other     Cold medicines cause dizziness   No outpatient medications have been marked as taking for the 09/04/19 encounter (Video Visit) with Wynn Banker, MD.    Review of Systems  Constitutional: Negative for chills, fatigue and fever.  Respiratory: Negative for cough, chest tightness, shortness of breath and wheezing.   Cardiovascular: Negative for chest pain, palpitations and leg swelling.  Psychiatric/Behavioral: Negative for suicidal ideas. The patient is nervous/anxious.        She is in a much better place now.     Objective:  There were no vitals taken for this visit.      BP Readings from Last 3 Encounters:  07/31/19 110/70  06/06/18 110/60  04/02/18 110/60   Wt Readings from Last 3 Encounters:  07/31/19 101 lb 14.4 oz (46.2 kg)  06/06/18 105 lb (47.6 kg) (8 %, Z= -1.39)*  04/02/18 102 lb 3.2 oz (46.4 kg) (5 %, Z= -1.61)*   * Growth percentiles are based on CDC  (Girls, 2-20 Years) data.    EXAM:  GENERAL: alert, oriented, appears well and in no acute distress  HEENT: atraumatic, conjunctiva clear, no obvious abnormalities on inspection of external nose and ears  NECK: normal movements of the head and neck  LUNGS: on inspection no signs of respiratory distress, breathing rate appears normal, no obvious gross SOB, gasping or wheezing  CV: no obvious cyanosis  MS: moves all visible extremities without noticeable abnormality  PSYCH/NEURO: pleasant and cooperative, no obvious depression or anxiety, speech and thought processing grossly intact. She does endorse daily anxiety, worry. She finds it difficult to control worry. Worse during school. Tries to keep busy to help with over-worrying. Frequently feels that mood slips and she is more depressed. Let's relationships and stress with relationships get to her.   Assessment/Plan  1. Anxiety Discussed options and because anxiety is still daily issue; we are going to increase prozac to 30mg  daily. She will update me through mychart in 1 month with how mood is doing.  2. Depressed mood See above. She is not having thoughts of self harm. Increase prozac to help with depression coverage. Follow up pending mychart update.    Return for mychart update in 1 month.   I discussed the assessment and treatment plan with the patient. The patient was provided an opportunity to ask questions and all were answered. The patient agreed with the plan and demonstrated an understanding of the instructions.  The patient was advised to call back or seek an in-person evaluation if the symptoms worsen or if the condition fails to improve as anticipated.  I provided 15 minutes of non-face-to-face time during this encounter.   Micheline Rough, MD

## 2019-10-18 ENCOUNTER — Other Ambulatory Visit: Payer: Self-pay | Admitting: Family Medicine

## 2019-10-18 DIAGNOSIS — K2960 Other gastritis without bleeding: Secondary | ICD-10-CM

## 2019-10-18 DIAGNOSIS — F419 Anxiety disorder, unspecified: Secondary | ICD-10-CM

## 2019-10-21 ENCOUNTER — Other Ambulatory Visit: Payer: Self-pay | Admitting: Family Medicine

## 2019-10-21 DIAGNOSIS — K2960 Other gastritis without bleeding: Secondary | ICD-10-CM

## 2019-10-21 DIAGNOSIS — F419 Anxiety disorder, unspecified: Secondary | ICD-10-CM

## 2019-11-07 ENCOUNTER — Encounter: Payer: Self-pay | Admitting: Family Medicine

## 2019-11-08 ENCOUNTER — Other Ambulatory Visit: Payer: Self-pay | Admitting: Family Medicine

## 2019-11-08 MED ORDER — AMOXICILLIN 500 MG PO CAPS
1000.0000 mg | ORAL_CAPSULE | Freq: Every day | ORAL | 0 refills | Status: AC
Start: 2019-11-08 — End: 2019-11-18

## 2020-01-16 ENCOUNTER — Other Ambulatory Visit: Payer: Self-pay | Admitting: Family Medicine

## 2020-01-16 DIAGNOSIS — K2960 Other gastritis without bleeding: Secondary | ICD-10-CM

## 2020-01-16 DIAGNOSIS — F419 Anxiety disorder, unspecified: Secondary | ICD-10-CM

## 2020-02-13 ENCOUNTER — Encounter: Payer: Self-pay | Admitting: Family Medicine

## 2020-02-13 DIAGNOSIS — G43809 Other migraine, not intractable, without status migrainosus: Secondary | ICD-10-CM

## 2020-02-13 DIAGNOSIS — H5702 Anisocoria: Secondary | ICD-10-CM

## 2020-02-19 ENCOUNTER — Ambulatory Visit: Payer: 59 | Admitting: Family Medicine

## 2020-02-19 ENCOUNTER — Other Ambulatory Visit: Payer: Self-pay

## 2020-02-19 ENCOUNTER — Encounter: Payer: Self-pay | Admitting: Family Medicine

## 2020-02-19 VITALS — BP 110/60 | HR 72 | Temp 98.3°F | Ht 62.0 in | Wt 100.8 lb

## 2020-02-19 DIAGNOSIS — H5702 Anisocoria: Secondary | ICD-10-CM | POA: Diagnosis not present

## 2020-02-19 NOTE — Progress Notes (Signed)
Patricia Valdez DOB: 18-Feb-2001 Encounter date: 02/19/2020  This is a 19 y.o. female who presents with Chief Complaint  Patient presents with   Eye Problem    History of present illness: Found picture from May where pupils were uneven. Not sure why it is happening, but was worried about it. Got head hit by plastic pole at work recently. Did have COVID in March and mom was worried it was related. Definitely happened this year. No eye pain, but does have a lot of headaches. Has always had these; but not worse. Works at [employer] and got hit in back of head with this about a week ago; not sure if related to her noticing it more.   Still doesn't have sense of smell  Allergies  Allergen Reactions   Other     Cold medicines cause dizziness   Current Meds  Medication Sig   FLUoxetine (PROZAC) 10 MG capsule Take 1 capsule (10 mg total) by mouth daily.   FLUoxetine (PROZAC) 20 MG capsule TAKE 1 CAPSULE BY MOUTH EVERY DAY **NEED OFFICE VISIT**   Norgestimate-Ethinyl Estradiol Triphasic (TRI-LO-MARZIA) 0.18/0.215/0.25 MG-25 MCG tab Take 1 tablet by mouth daily.   omeprazole (PRILOSEC) 20 MG capsule TAKE 1 CAPSULE BY MOUTH EVERY DAY   tretinoin (RETIN-A) 0.025 % cream Apply topically at bedtime.    Review of Systems  Constitutional: Negative for chills, fatigue and fever.  HENT: Negative for congestion, ear pain, facial swelling, hearing loss, rhinorrhea, sinus pressure, sinus pain and sore throat.        Lost sense of smell with covid and this has not returned.  Eyes: Negative for photophobia, pain, discharge, redness, itching and visual disturbance.  Respiratory: Negative for cough, chest tightness, shortness of breath and wheezing.   Cardiovascular: Negative for chest pain, palpitations and leg swelling.  Neurological: Negative for dizziness, speech difficulty, light-headedness and numbness. Headaches: occasional; no change from baseline.    Objective:  BP 110/60 (BP  Location: Right Arm, Patient Position: Sitting, Cuff Size: Normal)    Pulse 72    Temp 98.3 F (36.8 C) (Oral)    Ht 5\' 2"  (1.575 m)    Wt 100 lb 12.8 oz (45.7 kg)    LMP 01/26/2020 (Exact Date)    SpO2 98%    BMI 18.44 kg/m   Weight: 100 lb 12.8 oz (45.7 kg)   BP Readings from Last 3 Encounters:  02/19/20 110/60  07/31/19 110/70  06/06/18 110/60   Wt Readings from Last 3 Encounters:  02/19/20 100 lb 12.8 oz (45.7 kg)  07/31/19 101 lb 14.4 oz (46.2 kg)  06/06/18 105 lb (47.6 kg) (8 %, Z= -1.39)*   * Growth percentiles are based on CDC (Girls, 2-20 Years) data.    Physical Exam Constitutional:      General: She is not in acute distress.    Appearance: She is well-developed. She is not diaphoretic.  HENT:     Head: Normocephalic and atraumatic.     Right Ear: External ear normal.     Left Ear: External ear normal.  Eyes:     General: Lids are normal. Vision grossly intact. Gaze aligned appropriately.        Right eye: No foreign body, discharge or hordeolum.        Left eye: No foreign body, discharge or hordeolum.     Extraocular Movements: Extraocular movements intact.     Right eye: Normal extraocular motion and no nystagmus.     Left  eye: Normal extraocular motion and no nystagmus.     Conjunctiva/sclera: Conjunctivae normal.     Pupils: Pupils are unequal (left is larger than right).     Right eye: Pupil is round, reactive and not sluggish.     Left eye: Pupil is round, reactive and not sluggish.     Funduscopic exam:    Right eye: No hemorrhage, exudate or papilledema. Red reflex present.        Left eye: No hemorrhage, exudate or papilledema. Red reflex present.    Comments: Left pupil remains enlarged in light/dark room. Both pupils are reactive. No light sensitivity. No other noted exam abnormalities.  Neck:     Thyroid: No thyromegaly.  Cardiovascular:     Rate and Rhythm: Normal rate and regular rhythm.     Heart sounds: Normal heart sounds. No murmur heard.  No  friction rub. No gallop.   Pulmonary:     Effort: Pulmonary effort is normal. No respiratory distress.     Breath sounds: Normal breath sounds. No wheezing or rales.  Musculoskeletal:     Cervical back: Neck supple.  Lymphadenopathy:     Cervical: No cervical adenopathy.  Skin:    General: Skin is warm and dry.  Neurological:     General: No focal deficit present.     Mental Status: She is alert and oriented to person, place, and time.     Cranial Nerves: No cranial nerve deficit, dysarthria or facial asymmetry.     Sensory: Sensation is intact.     Motor: Motor function is intact. No weakness, tremor, atrophy, abnormal muscle tone, seizure activity or pronator drift.     Coordination: Coordination is intact. Romberg sign negative. Coordination normal. Finger-Nose-Finger Test normal. Rapid alternating movements normal.     Gait: Gait is intact. Gait and tandem walk normal.     Deep Tendon Reflexes: Reflexes are normal and symmetric. Reflexes normal.     Reflex Scores:      Tricep reflexes are 2+ on the right side and 2+ on the left side.      Bicep reflexes are 2+ on the right side and 2+ on the left side.      Brachioradialis reflexes are 2+ on the right side and 2+ on the left side.      Patellar reflexes are 2+ on the right side and 2+ on the left side. Psychiatric:        Behavior: Behavior normal.     Assessment/Plan  1. Anisocoria Exam is otherwise normal. I am going to refer to ophthalmology for further evaluation. If any new sx in meanwhile let us know.   - Ambulatory referral to Ophthalmology    Return if symptoms worsen or fail to improve.     Theodis Shove, MD

## 2020-04-09 ENCOUNTER — Other Ambulatory Visit: Payer: Self-pay | Admitting: Family Medicine

## 2020-04-09 DIAGNOSIS — K2960 Other gastritis without bleeding: Secondary | ICD-10-CM

## 2020-05-02 ENCOUNTER — Other Ambulatory Visit: Payer: Self-pay | Admitting: Family Medicine

## 2020-05-02 DIAGNOSIS — K2960 Other gastritis without bleeding: Secondary | ICD-10-CM

## 2020-05-11 ENCOUNTER — Other Ambulatory Visit: Payer: Self-pay | Admitting: Family Medicine

## 2020-05-11 DIAGNOSIS — K2960 Other gastritis without bleeding: Secondary | ICD-10-CM

## 2020-05-25 ENCOUNTER — Other Ambulatory Visit: Payer: Self-pay | Admitting: Family Medicine

## 2020-06-17 ENCOUNTER — Other Ambulatory Visit: Payer: Self-pay | Admitting: Family Medicine

## 2020-06-17 DIAGNOSIS — F419 Anxiety disorder, unspecified: Secondary | ICD-10-CM

## 2020-07-29 ENCOUNTER — Other Ambulatory Visit: Payer: Self-pay | Admitting: Family Medicine

## 2020-07-29 DIAGNOSIS — F419 Anxiety disorder, unspecified: Secondary | ICD-10-CM

## 2020-08-30 ENCOUNTER — Other Ambulatory Visit: Payer: Self-pay | Admitting: Family Medicine

## 2020-08-30 DIAGNOSIS — F419 Anxiety disorder, unspecified: Secondary | ICD-10-CM

## 2020-10-03 ENCOUNTER — Other Ambulatory Visit: Payer: Self-pay | Admitting: Family Medicine

## 2020-10-04 NOTE — Telephone Encounter (Signed)
Please clarify current dose of Prozac?  At 1 point she was taking 20 mg daily.  She is due for follow-up visit.  Okay to refill at her current dose for 90-day supply to get her through until her follow-up appointment.

## 2020-10-05 ENCOUNTER — Encounter: Payer: Self-pay | Admitting: Family Medicine

## 2020-10-05 NOTE — Telephone Encounter (Signed)
Patient stopped taking and does not want refill.

## 2020-10-19 ENCOUNTER — Other Ambulatory Visit: Payer: Self-pay

## 2020-10-19 ENCOUNTER — Encounter: Payer: Self-pay | Admitting: Family Medicine

## 2020-10-19 ENCOUNTER — Ambulatory Visit (INDEPENDENT_AMBULATORY_CARE_PROVIDER_SITE_OTHER): Payer: 59 | Admitting: Family Medicine

## 2020-10-19 VITALS — BP 90/70 | HR 51 | Temp 98.6°F | Ht 62.25 in | Wt 101.7 lb

## 2020-10-19 DIAGNOSIS — E039 Hypothyroidism, unspecified: Secondary | ICD-10-CM

## 2020-10-19 DIAGNOSIS — Z23 Encounter for immunization: Secondary | ICD-10-CM

## 2020-10-19 DIAGNOSIS — F419 Anxiety disorder, unspecified: Secondary | ICD-10-CM | POA: Diagnosis not present

## 2020-10-19 DIAGNOSIS — Z13220 Encounter for screening for lipoid disorders: Secondary | ICD-10-CM

## 2020-10-19 DIAGNOSIS — Z131 Encounter for screening for diabetes mellitus: Secondary | ICD-10-CM | POA: Diagnosis not present

## 2020-10-19 DIAGNOSIS — N920 Excessive and frequent menstruation with regular cycle: Secondary | ICD-10-CM | POA: Diagnosis not present

## 2020-10-19 DIAGNOSIS — F900 Attention-deficit hyperactivity disorder, predominantly inattentive type: Secondary | ICD-10-CM

## 2020-10-19 DIAGNOSIS — G43009 Migraine without aura, not intractable, without status migrainosus: Secondary | ICD-10-CM | POA: Diagnosis not present

## 2020-10-19 DIAGNOSIS — Z0000 Encounter for general adult medical examination without abnormal findings: Secondary | ICD-10-CM | POA: Diagnosis not present

## 2020-10-19 LAB — CBC WITH DIFFERENTIAL/PLATELET
Basophils Absolute: 0.1 10*3/uL (ref 0.0–0.1)
Basophils Relative: 1.3 % (ref 0.0–3.0)
Eosinophils Absolute: 0.3 10*3/uL (ref 0.0–0.7)
Eosinophils Relative: 5.2 % — ABNORMAL HIGH (ref 0.0–5.0)
HCT: 39.1 % (ref 36.0–46.0)
Hemoglobin: 13.2 g/dL (ref 12.0–15.0)
Lymphocytes Relative: 35.3 % (ref 12.0–46.0)
Lymphs Abs: 2.1 10*3/uL (ref 0.7–4.0)
MCHC: 33.7 g/dL (ref 30.0–36.0)
MCV: 92.2 fl (ref 78.0–100.0)
Monocytes Absolute: 0.3 10*3/uL (ref 0.1–1.0)
Monocytes Relative: 5.7 % (ref 3.0–12.0)
Neutro Abs: 3.2 10*3/uL (ref 1.4–7.7)
Neutrophils Relative %: 52.5 % (ref 43.0–77.0)
Platelets: 254 10*3/uL (ref 150.0–400.0)
RBC: 4.24 Mil/uL (ref 3.87–5.11)
RDW: 13.5 % (ref 11.5–15.5)
WBC: 6 10*3/uL (ref 4.0–10.5)

## 2020-10-19 LAB — LIPID PANEL
Cholesterol: 163 mg/dL (ref 0–200)
HDL: 62.2 mg/dL (ref >39.00)
LDL Cholesterol: 76 mg/dL (ref 0–99)
NonHDL: 100.38
Total CHOL/HDL Ratio: 3
Triglycerides: 121 mg/dL (ref 0.0–149.0)
VLDL: 24.2 mg/dL (ref 0.0–40.0)

## 2020-10-19 LAB — COMPREHENSIVE METABOLIC PANEL
ALT: 8 U/L (ref 0–35)
AST: 14 U/L (ref 0–37)
Albumin: 4.4 g/dL (ref 3.5–5.2)
Alkaline Phosphatase: 40 U/L (ref 39–117)
BUN: 16 mg/dL (ref 6–23)
CO2: 24 mEq/L (ref 19–32)
Calcium: 9.5 mg/dL (ref 8.4–10.5)
Chloride: 104 mEq/L (ref 96–112)
Creatinine, Ser: 0.68 mg/dL (ref 0.40–1.20)
GFR: 123.99 mL/min (ref >60.00)
Glucose, Bld: 85 mg/dL (ref 70–99)
Potassium: 4.4 mEq/L (ref 3.5–5.1)
Sodium: 137 mEq/L (ref 135–145)
Total Bilirubin: 0.8 mg/dL (ref 0.2–1.2)
Total Protein: 7 g/dL (ref 6.0–8.3)

## 2020-10-19 LAB — IBC + FERRITIN
Ferritin: 23.7 ng/mL (ref 10.0–291.0)
Iron: 144 ug/dL (ref 42–145)
Saturation Ratios: 27.4 % (ref 20.0–50.0)
Transferrin: 376 mg/dL — ABNORMAL HIGH (ref 212.0–360.0)

## 2020-10-19 LAB — T3, FREE: T3, Free: 3.4 pg/mL (ref 2.3–4.2)

## 2020-10-19 LAB — T4, FREE: Free T4: 0.81 ng/dL (ref 0.60–1.60)

## 2020-10-19 LAB — TSH: TSH: 2.49 u[IU]/mL (ref 0.35–5.50)

## 2020-10-19 MED ORDER — AMPHETAMINE-DEXTROAMPHET ER 5 MG PO CP24: 5.0000 mg | ORAL_CAPSULE | Freq: Every day | ORAL | 0 refills | Status: DC

## 2020-10-19 NOTE — Progress Notes (Signed)
Patricia Valdez DOB: [birth year] Encounter date: 10/19/2020  This is a 20 y.o. female who presents for complete physical   History of present illness/Additional concerns: Last visit was 01/2020.   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 Needs titers/vaccines.   Migraine: Anxiety: she feels very irritable. Feels like she is irritable with others before she can control it. Feels more anxious than she does feel sad. Feels the anxiety makes her more irritable. Working at [employer] works, [employer] now. Has been on prozac and lexapro in the past and had stopped due to just having hard time fitting taking this into her schedule. Didn't note that irritability was necessarily better on prozac. She does drink some alcohol on weekends, but has cut this out. Started prozac 10mg  back.   Does have hx of ADHD and was on adderall, vyvanse. Was treated in elementary school and middle school. Hasn't taken anything since 8th grade. Thinks medicine helped her, but appetite was issue. Thinks focus is issue at work; not issue with relationships. She is a good listener with others.   Has been on prozac, lexapro. No other medications. Hasn't felt like these helped; even up to prozac 30mg . Has done counseling - but this was expensive for her. She works two jobs to try and pay for things   Exercises at least a few days/week - walking, stretching.   Follows with Dr. ([facility]) for gyn care regularly.  Follows with dermatology regularly (off [address])  Past Medical History:  Diagnosis Date   Anxiety    Migraine    Past Surgical History:  Procedure Laterality Date   WISDOM TOOTH EXTRACTION  2018   No Known Allergies  Current Meds  Medication Sig   amphetamine-dextroamphetamine (ADDERALL XR) 5 MG 24 hr capsule Take 1 capsule (5 mg total) by mouth daily.   FLUoxetine (PROZAC) 10 MG capsule Take 10 mg by mouth daily.   levonorgestrel-ethinyl estradiol (LARISSIA) 0.1-20 MG-MCG tablet  Take 1 tablet by mouth daily.   tretinoin (RETIN-A) 0.025 % cream Apply topically at bedtime.   Social History   Tobacco Use   Smoking status: Never   Smokeless tobacco: Never  Substance Use Topics   Alcohol use: No    Alcohol/week: 0.0 standard drinks   Family History  Problem Relation Age of Onset   Arthritis Mother    Depression Mother    Diabetes Mother    Hypertension Mother    Hyperlipidemia Mother    Arthritis Father    Depression Father    Hyperlipidemia Father    Depression Sister    Hypertension Sister    Depression Maternal Grandmother    Hyperlipidemia Maternal Grandmother    Hypertension Maternal Grandmother    Arthritis Maternal Grandfather    Cancer Maternal Grandfather    Depression Maternal Grandfather    Hyperlipidemia Maternal Grandfather    Hypertension Maternal Grandfather    Arthritis Paternal Grandmother    Cancer Paternal Grandmother    Depression Paternal Grandmother    Hyperlipidemia Paternal Grandmother    Hypertension Paternal Grandmother    Arthritis Paternal Grandfather    Cancer Paternal Grandfather    Depression Sister      Review of Systems  Constitutional:  Negative for activity change, appetite change, chills, fatigue, fever and unexpected weight change.  HENT:  Negative for congestion, ear pain, hearing loss, sinus pressure, sinus pain, sore throat and trouble swallowing.   Eyes:  Negative for pain and visual disturbance.  Respiratory:  Negative for cough, chest tightness, shortness of breath and wheezing.   Cardiovascular:  Negative for chest pain, palpitations and leg swelling.  Gastrointestinal:  Negative for abdominal pain, blood in stool, constipation, diarrhea, nausea and vomiting.  Genitourinary:  Negative for difficulty urinating and menstrual problem.  Musculoskeletal:  Negative for arthralgias and back pain.  Skin:  Negative for rash.  Neurological:  Negative for dizziness, weakness, numbness and headaches.   Hematological:  Negative for adenopathy. Does not bruise/bleed easily.  Psychiatric/Behavioral:  Positive for decreased concentration. Negative for sleep disturbance and suicidal ideas. The patient is nervous/anxious (always nervous; has always been anxious).    CBC:  Lab Results  Component Value Date   WBC 6.0 10/19/2020   HGB 13.2 10/19/2020   HCT 39.1 10/19/2020   MCH 31.2 12/26/2014   MCHC 33.7 10/19/2020   RDW 13.5 10/19/2020   PLT 254.0 10/19/2020   CMP: Lab Results  Component Value Date   NA 137 10/19/2020   K 4.4 10/19/2020   CL 104 10/19/2020   CO2 24 10/19/2020   ANIONGAP 6 12/26/2014   GLUCOSE 85 10/19/2020   BUN 16 10/19/2020   CREATININE 0.68 10/19/2020   GFRAA NOT CALCULATED 12/26/2014   CALCIUM 9.5 10/19/2020   PROT 7.0 10/19/2020   BILITOT 0.8 10/19/2020   ALKPHOS 40 10/19/2020   ALT 8 10/19/2020   AST 14 10/19/2020   LIPID: Lab Results  Component Value Date   CHOL 163 10/19/2020   TRIG 121.0 10/19/2020   HDL 62.20 10/19/2020   LDLCALC 76 10/19/2020    Objective:  BP 90/70 (BP Location: Left Arm, Patient Position: Sitting, Cuff Size: Normal)   Pulse (!) 51   Temp 98.6 F (37 C) (Oral)   Ht 5' 2.25" (1.581 m)   Wt 101 lb 11.2 oz (46.1 kg)   LMP 10/06/2020 (Exact Date)   SpO2 98%   BMI 18.45 kg/m   Weight: 101 lb 11.2 oz (46.1 kg)   BP Readings from Last 3 Encounters:  10/19/20 90/70  02/19/20 110/60  07/31/19 110/70   Wt Readings from Last 3 Encounters:  10/19/20 101 lb 11.2 oz (46.1 kg)  02/19/20 100 lb 12.8 oz (45.7 kg)  07/31/19 101 lb 14.4 oz (46.2 kg)    Physical Exam Constitutional:      General: She is not in acute distress.    Appearance: She is well-developed.  HENT:     Head: Normocephalic and atraumatic.     Right Ear: External ear normal.     Left Ear: External ear normal.     Mouth/Throat:     Pharynx: No oropharyngeal exudate.  Eyes:     Conjunctiva/sclera: Conjunctivae normal.     Pupils: Pupils are equal,  round, and reactive to light.  Neck:     Thyroid: No thyromegaly.  Cardiovascular:     Rate and Rhythm: Normal rate and regular rhythm.     Heart sounds: Normal heart sounds. No murmur heard.   No friction rub. No gallop.  Pulmonary:     Effort: Pulmonary effort is normal.     Breath sounds: Normal breath sounds.  Abdominal:     General: Bowel sounds are normal. There is no distension.     Palpations: Abdomen is soft. There is no mass.     Tenderness: There is no abdominal tenderness. There is no guarding.     Hernia: No hernia is present.  Musculoskeletal:        General: No tenderness or  deformity. Normal range of motion.     Cervical back: Normal range of motion and neck supple.  Lymphadenopathy:     Cervical: No cervical adenopathy.  Skin:    General: Skin is warm and dry.     Findings: No rash.  Neurological:     Mental Status: She is alert and oriented to person, place, and time.     Deep Tendon Reflexes: Reflexes normal.     Reflex Scores:      Tricep reflexes are 2+ on the right side and 2+ on the left side.      Bicep reflexes are 2+ on the right side and 2+ on the left side.      Brachioradialis reflexes are 2+ on the right side and 2+ on the left side.      Patellar reflexes are 2+ on the right side and 2+ on the left side. Psychiatric:        Speech: Speech normal.        Behavior: Behavior normal.        Thought Content: Thought content normal.    Assessment/Plan: Health Maintenance Due  Topic Date Due   HPV VACCINES (1 - 2-dose series) Never done   Hepatitis C Screening  Never done   PAP-Cervical Cytology Screening  Never done   PAP SMEAR-Modifier  Never done   COVID-19 Vaccine (3 - Booster for Pfizer series) 04/19/2020   Health Maintenance reviewed.  1. Preventative health care Will get needed titers today for dental program; keep up with regular exercise.  - Tdap vaccine greater than or equal to 7yo IM - Varicella Zoster Antibody, IgG; Future - Hep  B Surface Antibody; Future - Measles/Mumps/Rubella Immunity; Future - QuantiFERON-TB Gold Plus; Future - Varicella Zoster Antibody, IgG - Hep B Surface Antibody - Measles/Mumps/Rubella Immunity - QuantiFERON-TB Gold Plus  2. Hypothyroidism, unspecified type - T4, free; Future - T3, free; Future - T4, free - T3, free  3. Migraine without aura and without status migrainosus, not intractable Improved with her focus on increased water intake.  4. Anxiety Felt that it was worse off prozac, but doesn't note huge improvement with the prozac either. We are going to continue 10mg  prozac and see if adding tx for add is helpful for anxiety/irritability.  5. Menorrhagia with regular cycle She does follow with gyn; we are going to get bloodwork to eval for heavy bleeding.  - CBC with Differential/Platelet; Future - TSH; Future - CBC with Differential/Platelet - TSH - IBC + Ferritin; Future - IBC + Ferritin  6. Attention deficit hyperactivity disorder (ADHD), predominantly inattentive type Start with low dose treatment. Follow up via mychart update in next month/in visit re-eval in 2 mo.  - amphetamine-dextroamphetamine (ADDERALL XR) 5 MG 24 hr capsule; Take 1 capsule (5 mg total) by mouth daily.  Dispense: 30 capsule; Refill: 0  7. Lipid screening - Lipid panel; Future - Lipid panel  8. Screening for diabetes mellitus - Comprehensive metabolic panel; Future - Comprehensive metabolic panel  9. Need for Tdap vaccination  Completed today.  Return in about 2 months (around 12/20/2020) for Chronic condition visit - anxiety, adhd.  12/22/2020, MD

## 2020-10-20 ENCOUNTER — Encounter: Payer: Self-pay | Admitting: Family Medicine

## 2020-10-20 DIAGNOSIS — F988 Other specified behavioral and emotional disorders with onset usually occurring in childhood and adolescence: Secondary | ICD-10-CM | POA: Insufficient documentation

## 2020-10-21 NOTE — Telephone Encounter (Signed)
Spoke with the patient and informed her of the results (see results note).  Patient was advised there are no previous records I see in media and if she signed a release form yesterday, it can take weeks or longer for records to be received.  Patient informed the records will be reviewed by Dr Hassan Rowan once received.

## 2020-10-22 LAB — QUANTIFERON-TB GOLD PLUS
Mitogen-NIL: >10 IU/mL
NIL: 0.03 IU/mL
QuantiFERON-TB Gold Plus: NEGATIVE
TB1-NIL: 0 IU/mL
TB2-NIL: 0 IU/mL

## 2020-10-22 LAB — HEPATITIS B SURFACE ANTIBODY,QUALITATIVE: Hep B S Ab: NONREACTIVE

## 2020-10-22 LAB — MEASLES/MUMPS/RUBELLA IMMUNITY
Mumps IgG: 32.8 AU/mL
Rubella: 4.49 Index
Rubeola IgG: 37.9 AU/mL

## 2020-10-22 LAB — VARICELLA ZOSTER ANTIBODY, IGG: Varicella IgG: 1045 index

## 2020-10-27 ENCOUNTER — Other Ambulatory Visit: Payer: Self-pay

## 2020-10-27 ENCOUNTER — Ambulatory Visit (INDEPENDENT_AMBULATORY_CARE_PROVIDER_SITE_OTHER): Payer: 59

## 2020-10-27 DIAGNOSIS — Z23 Encounter for immunization: Secondary | ICD-10-CM

## 2020-10-27 NOTE — Progress Notes (Signed)
Per orders of Dr Hassan Rowan, pt was given Hep B vaccine by Steffanie Rainwater, pt tolerated well.

## 2020-11-01 ENCOUNTER — Other Ambulatory Visit: Payer: Self-pay | Admitting: Family Medicine

## 2020-12-01 ENCOUNTER — Ambulatory Visit: Payer: 59

## 2020-12-04 ENCOUNTER — Ambulatory Visit (INDEPENDENT_AMBULATORY_CARE_PROVIDER_SITE_OTHER): Payer: 59

## 2020-12-04 ENCOUNTER — Other Ambulatory Visit: Payer: Self-pay

## 2020-12-04 DIAGNOSIS — Z23 Encounter for immunization: Secondary | ICD-10-CM

## 2020-12-04 NOTE — Addendum Note (Signed)
Addended by: Waymon Amato R on: 12/04/2020 03:33 PM   Modules accepted: Orders

## 2020-12-04 NOTE — Progress Notes (Signed)
Per orders of Dr. Hassan Rowan, injection of 2nd HepB given by Sherrin Daisy. Patient tolerated injection well.

## 2020-12-25 ENCOUNTER — Ambulatory Visit: Payer: 59 | Admitting: Family Medicine

## 2020-12-30 ENCOUNTER — Telehealth (INDEPENDENT_AMBULATORY_CARE_PROVIDER_SITE_OTHER): Payer: 59 | Admitting: Family Medicine

## 2020-12-30 DIAGNOSIS — F419 Anxiety disorder, unspecified: Secondary | ICD-10-CM

## 2020-12-30 MED ORDER — FLUOXETINE HCL 20 MG PO CAPS: ORAL_CAPSULE | ORAL | 0 refills | Status: DC

## 2020-12-30 MED ORDER — FLUOXETINE HCL 10 MG PO CAPS: ORAL_CAPSULE | ORAL | 1 refills | Status: DC

## 2020-12-30 NOTE — Progress Notes (Signed)
Virtual Visit via Video Note  I connected with Patricia Valdez  on 12/30/20 at  2:00 PM EDT by a video enabled telemedicine application and verified that I am speaking with the correct person using two identifiers.  Location patient: home Location provider: [facility]  [address] [place], [place] [address] Persons participating in the virtual visit: patient, provider  I discussed the limitations of evaluation and management by telemedicine and the availability of in person appointments. The patient expressed understanding and agreed to proceed.   Patricia Valdez DOB: 2000/10/20 Encounter date: 12/30/2020  This is a 20 y.o. female who presents with No chief complaint on file.   History of present illness: ADD: at last visit we started low dose adderall 5mg  daily and we continued prozac 30mg  daily for anxiety/irritability.   *school is crazy - she states it is very hard. She is doing well. Feels like she has been great. Still feels like she struggles with irritability; better if she is working on being positive.   *stopped taking the adderall. Doesn't feel like she needs it anymore. Happy about not needing this. Focusing really well. She started having tics again that she got when she was younger on the medication. Just feels better off of medication.   *still working at . Stopped job at and [employer] works.   She has stayed on the prozac. Doing well with the 30mg  dosing. Does not want to change this. She had tried to stop in past and mood was not good off of this.  No Known Allergies No outpatient medications have been marked as taking for the 12/30/20 encounter (Video Visit) with [employer], MD.    Review of Systems  Constitutional:  Negative for chills, fatigue and fever.  Respiratory:  Negative for cough, chest tightness, shortness of breath and wheezing.   Cardiovascular:  Negative for chest pain, palpitations and leg swelling.   Psychiatric/Behavioral:  Negative for decreased concentration and sleep disturbance. Nervous/anxious: stable. doing well with prozac 30mg  daily.    Objective:  There were no vitals taken for this visit.      BP Readings from Last 3 Encounters:  10/19/20 90/70  02/19/20 110/60  07/31/19 110/70   Wt Readings from Last 3 Encounters:  10/19/20 101 lb 11.2 oz (46.1 kg)  02/19/20 100 lb 12.8 oz (45.7 kg)  07/31/19 101 lb 14.4 oz (46.2 kg)    EXAM:  GENERAL: alert, oriented, appears well and in no acute distress  HEENT: atraumatic, conjunctiva clear, no obvious abnormalities on inspection of external nose and ears  NECK: normal movements of the head and neck  LUNGS: on inspection no signs of respiratory distress, breathing rate appears normal, no obvious gross SOB, gasping or wheezing  CV: no obvious cyanosis  MS: moves all visible extremities without noticeable abnormality  PSYCH/NEURO: pleasant and cooperative, no obvious depression or anxiety, speech and thought processing grossly intact   Assessment/Plan  1. Anxiety Doing well with prozac 30mg  daily. She will message when she is due for refill; we got off cycle due to her holding medication for period of time.   Return in about 6 months (around 06/30/2021) for Chronic condition visit, 3rd hep B vaccination.     I discussed the assessment and treatment plan with the patient. The patient was provided an opportunity to ask questions and all were answered. The patient agreed with the plan and demonstrated an understanding of the instructions.   The patient was  advised to call back or seek an in-person evaluation if the symptoms worsen or if the condition fails to improve as anticipated.  I provided 15 minutes of face-to-face time during this encounter.   Theodis Shove, MD

## 2020-12-31 ENCOUNTER — Telehealth: Payer: Self-pay | Admitting: *Deleted

## 2020-12-31 NOTE — Telephone Encounter (Signed)
-----   Message from Wynn Banker, MD sent at 12/30/2020  2:21 PM EDT ----- Please set up 6 mo follow up visit

## 2020-12-31 NOTE — Telephone Encounter (Signed)
Left a detailed message at the patient's cell number to schedule an appt as below.   

## 2021-01-25 ENCOUNTER — Emergency Department (HOSPITAL_BASED_OUTPATIENT_CLINIC_OR_DEPARTMENT_OTHER)
Admission: EM | Admit: 2021-01-25 | Discharge: 2021-01-26 | Disposition: A | Discharge status: Home / Self Care | Payer: 59 | Attending: Emergency Medicine | Admitting: Emergency Medicine

## 2021-01-25 ENCOUNTER — Emergency Department (HOSPITAL_BASED_OUTPATIENT_CLINIC_OR_DEPARTMENT_OTHER): Payer: 59

## 2021-01-25 ENCOUNTER — Encounter (HOSPITAL_BASED_OUTPATIENT_CLINIC_OR_DEPARTMENT_OTHER): Payer: Self-pay | Admitting: Emergency Medicine

## 2021-01-25 ENCOUNTER — Other Ambulatory Visit: Payer: Self-pay

## 2021-01-25 DIAGNOSIS — M545 Low back pain, unspecified: Secondary | ICD-10-CM | POA: Diagnosis present

## 2021-01-25 DIAGNOSIS — Z79899 Other long term (current) drug therapy: Secondary | ICD-10-CM | POA: Diagnosis not present

## 2021-01-25 DIAGNOSIS — Y92410 Unspecified street and highway as the place of occurrence of the external cause: Secondary | ICD-10-CM | POA: Diagnosis not present

## 2021-01-25 LAB — PREGNANCY, URINE: Preg Test, Ur: NEGATIVE
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
## 2021-02-08 ENCOUNTER — Encounter: Payer: Self-pay | Admitting: Family Medicine

## 2021-03-08 ENCOUNTER — Encounter: Payer: Self-pay | Admitting: Family Medicine

## 2021-03-08 ENCOUNTER — Ambulatory Visit: Payer: 59 | Admitting: Family Medicine

## 2021-03-08 VITALS — BP 108/70 | HR 60 | Temp 98.3°F | Ht 62.0 in | Wt 101.5 lb

## 2021-03-08 DIAGNOSIS — R030 Elevated blood-pressure reading, without diagnosis of hypertension: Secondary | ICD-10-CM

## 2021-03-08 NOTE — Patient Instructions (Signed)
Check blood pressure regularly at least for the next couple of weeks. Check after sitting 5 minutes, arm at chest level. We want your pressure to be less than 130/85 (lower is better). As long as your reads are less than this (hoping close to your 100's/70's in the office today) then I do not feel we need to follow up. If you find they are elevated, let me know.
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
## 2021-04-01 ENCOUNTER — Other Ambulatory Visit: Payer: Self-pay | Admitting: Family Medicine

## 2021-04-01 DIAGNOSIS — F419 Anxiety disorder, unspecified: Secondary | ICD-10-CM

## 2021-04-06 ENCOUNTER — Encounter: Payer: Self-pay | Admitting: Family Medicine

## 2021-05-08 ENCOUNTER — Other Ambulatory Visit: Payer: Self-pay | Admitting: Family Medicine

## 2021-05-08 NOTE — Telephone Encounter (Signed)
Please clarify requested current dose. Last dose we sent was 20mg , but refill req was for previous 10mg 

## 2021-05-10 NOTE — Telephone Encounter (Signed)
Left a detailed message for the patient to call back with the exact dose she is taking at this time.

## 2021-05-12 ENCOUNTER — Telehealth: Payer: Self-pay | Admitting: Family Medicine

## 2021-05-12 ENCOUNTER — Other Ambulatory Visit: Payer: Self-pay | Admitting: Family Medicine

## 2021-05-12 NOTE — Telephone Encounter (Signed)
Pt call and stated she is returning your call to let you know she only taken the 20 of Sluoxetine .

## 2021-05-12 NOTE — Telephone Encounter (Signed)
Left a detailed message for the patient to call back with the exact dose she is taking at this time. °

## 2021-05-12 NOTE — Telephone Encounter (Signed)
Noted. She has refills left on this. Will deny pharm refill for 10mg 

## 2021-07-02 ENCOUNTER — Ambulatory Visit: Payer: 59 | Admitting: Family Medicine

## 2021-07-16 ENCOUNTER — Encounter: Payer: Self-pay | Admitting: Family Medicine

## 2021-07-16 ENCOUNTER — Ambulatory Visit: Payer: 59 | Admitting: Family Medicine

## 2021-07-16 VITALS — BP 98/70 | HR 63 | Temp 98.4°F | Ht 62.0 in | Wt 107.7 lb

## 2021-07-16 DIAGNOSIS — F419 Anxiety disorder, unspecified: Secondary | ICD-10-CM

## 2021-07-16 DIAGNOSIS — Z23 Encounter for immunization: Secondary | ICD-10-CM | POA: Diagnosis not present

## 2021-07-16 DIAGNOSIS — F900 Attention-deficit hyperactivity disorder, predominantly inattentive type: Secondary | ICD-10-CM

## 2021-07-16 DIAGNOSIS — F339 Major depressive disorder, recurrent, unspecified: Secondary | ICD-10-CM | POA: Diagnosis not present

## 2021-07-16 DIAGNOSIS — L709 Acne, unspecified: Secondary | ICD-10-CM

## 2021-07-16 MED ORDER — TRETINOIN 0.025 % EX CREA: TOPICAL_CREAM | Freq: Every day | CUTANEOUS | 5 refills | Status: AC

## 2021-07-16 MED ORDER — FLUOXETINE HCL 40 MG PO CAPS: 40.0000 mg | ORAL_CAPSULE | Freq: Every day | ORAL | 3 refills | Status: DC

## 2021-07-16 NOTE — Patient Instructions (Addendum)
Check supply of prozac at home to make sure you have enough of the 20 and the 10 to take 40mg  regularly; let me know if not.  ? ?*let me know if mood is not better in about 3 weeks with dose increase.  ? ?*throw away scale :) ? ?*find therapist and get appointment scheduled ? ?*plan fun activity every 2 weeks (at minimum) ? ? ?

## 2021-07-16 NOTE — Progress Notes (Signed)
?[place] ?DOB: Mar 12, 2001 ?Encounter date: 07/16/2021 ? ?This is a 21 y.o. female who presents with ?Chief Complaint  ?Patient presents with  ? Follow-up  ? ? ?History of present illness: ?Done with school in July. Headaches are better. Bp is good. ? ?Went off prozac for a little while and that wasn't good. Gets worse off meds. Depression gets worse off meds(usually anxiety that worsens). Feels overwhelmed/sad all the time. Was sleeping to manage this. Still working 20 hours/week and school 40 hours/week. Studying late at night.  ? ? ?  07/16/2021  ?  2:11 PM 03/08/2021  ?  9:41 AM 10/19/2020  ? 10:34 AM 06/06/2018  ? 12:13 PM 03/14/2018  ? 11:16 AM  ?Depression screen PHQ 2/9  ?Decreased Interest 1 1 0 1 1  ?Down, Depressed, Hopeless 1 0 '1 1 2  ' ?PHQ - 2 Score '2 1 1 2 3  ' ?Altered sleeping '1 2 3 ' 0 3  ?Tired, decreased energy '1 1 1 ' 0 3  ?Change in appetite '1 1 1 ' 0 3  ?Feeling bad or failure about yourself  2 1 0 1 2  ?Trouble concentrating 0 '2 3 1 3  ' ?Moving slowly or fidgety/restless 0 0 0 0 2  ?Suicidal thoughts 0 0 0 0 1  ?PHQ-9 Score '7 8 9 4 20  ' ?Difficult doing work/chores Somewhat difficult   Not difficult at all Very difficult  ? ?Not much time for enjoying herself. Does enjoy doing things with friends, but is very limited between school and work hours. Not enjoying school. Interested in changing careers perhaps with focus on social media. ? ?No Known Allergies ?Current Meds  ?Medication Sig  ? FLUoxetine (PROZAC) 40 MG capsule Take 1 capsule (40 mg total) by mouth daily.  ? levonorgestrel-ethinyl estradiol (ALESSE) 0.1-20 MG-MCG tablet Take 1 tablet by mouth daily.  ? [DISCONTINUED] FLUoxetine (PROZAC) 20 MG capsule TAKE 1 CAPSULE BY MOUTH EVERY DAY IN THE MORNING  ? [DISCONTINUED] tretinoin (RETIN-A) 0.025 % cream Apply topically at bedtime.  ? ? ?Review of Systems  ?Constitutional:  Negative for chills, fatigue and fever.  ?Respiratory:  Negative for cough, chest tightness, shortness of breath and  wheezing.   ?Cardiovascular:  Negative for chest pain, palpitations and leg swelling.  ? ?Objective: ? ?BP 98/70 (BP Location: Left Arm, Patient Position: Sitting, Cuff Size: Normal)   Pulse 63   Temp 98.4 ?F (36.9 ?C) (Oral)   Ht '5\' 2"'  (1.575 m)   Wt 107 lb 11.2 oz (48.9 kg)   LMP 07/13/2021 (Exact Date)   SpO2 98%   BMI 19.70 kg/m?   Weight: 107 lb 11.2 oz (48.9 kg)  ? ?BP Readings from Last 3 Encounters:  ?07/16/21 98/70  ?03/08/21 108/70  ?01/25/21 (!) 148/82  ? ?Wt Readings from Last 3 Encounters:  ?07/16/21 107 lb 11.2 oz (48.9 kg)  ?03/08/21 101 lb 8 oz (46 kg)  ?01/25/21 103 lb (46.7 kg)  ? ? ?Physical Exam ?Constitutional:   ?   General: She is not in acute distress. ?   Appearance: She is well-developed.  ?Cardiovascular:  ?   Rate and Rhythm: Normal rate and regular rhythm.  ?   Heart sounds: Normal heart sounds. No murmur heard. ?  No friction rub.  ?Pulmonary:  ?   Effort: Pulmonary effort is normal. No respiratory distress.  ?   Breath sounds: Normal breath sounds. No wheezing or rales.  ?Musculoskeletal:  ?   Right lower leg: No edema.  ?  Left lower leg: No edema.  ?Neurological:  ?   Mental Status: She is alert and oriented to person, place, and time.  ?Psychiatric:     ?   Attention and Perception: Attention normal.     ?   Mood and Affect: Mood is depressed.     ?   Speech: Speech normal.     ?   Behavior: Behavior normal.     ?   Cognition and Memory: Cognition normal.  ?   Comments: She is very self critical. Self esteem is low. She does worry about her weight. She has taken breaks from things that negatively influence her (like social media), but tends to focus on what she feels are negative physical characteristics. She has good support system with mom, friends. No thoughts of hurting self.  ? ? ?Assessment/Plan ? ?1. Attention deficit hyperactivity disorder (ADHD), predominantly inattentive type ?Currently not on medication. Grades are doing well. This does not seem to be affecting  her as much currently. ? ?2. Anxiety ?She has always been a Research officer, trade [employer].  She would like to increase her Prozac dosage to 40 mg daily.  She will let me know if she needs a refill on this as she does have some leftover pills at home from when she stopped taking previously. ? ?3. Depression, recurrent ([place]) ?She is interested in doing therapy.  She is going to look into this.  Previous therapist she met with was not a good match for her, but we discussed benefits of therapy for helping with more positive self thinking and coping skills.  I have asked her to update me through MyChart if mood is not improved in about 3 weeks time.  We discussed some tools for positive thinking including eliminating or working on eliminating things that cause recurrent negative self thinking (like her scale).  We discussed working on things to focus on overall health, like regular exercise, rather than physical image.  We discussed planning fun activities/breaks from studying and work so that she can enjoy time with friends and have something to look forward to. ? ?4. Acne, unspecified acne type ?- tretinoin (RETIN-A) 0.025 % cream; Apply topically at bedtime.  Dispense: 45 g; Refill: 5 ? ?5. Need for hepatitis B vaccination ?- Heplisav-B (HepB-CPG) Vaccine ? ? ? ? ? ? ? ?Micheline Rough, MD ?

## 2021-10-15 ENCOUNTER — Other Ambulatory Visit: Payer: Self-pay | Admitting: *Deleted

## 2021-10-15 MED ORDER — FLUOXETINE HCL 40 MG PO CAPS: 40.0000 mg | ORAL_CAPSULE | Freq: Every day | ORAL | 0 refills | Status: DC

## 2021-10-19 ENCOUNTER — Other Ambulatory Visit: Payer: Self-pay | Admitting: *Deleted

## 2021-10-19 MED ORDER — FLUOXETINE HCL 40 MG PO CAPS: 40.0000 mg | ORAL_CAPSULE | Freq: Every day | ORAL | 0 refills | Status: DC

## 2021-10-19 NOTE — Telephone Encounter (Signed)
I think this was filled by the NP on 10/15/21. She will need an appt with me in October for a 6 month follow up

## 2021-10-20 NOTE — Telephone Encounter (Signed)
Left a detailed message at the patient's cell number to call the office for an appt as below.

## 2021-11-24 ENCOUNTER — Ambulatory Visit: Payer: 59 | Admitting: Physician Assistant

## 2021-12-29 ENCOUNTER — Other Ambulatory Visit: Payer: Self-pay | Admitting: Family Medicine

## 2022-01-24 ENCOUNTER — Other Ambulatory Visit: Payer: Self-pay | Admitting: Family Medicine

## 2022-02-15 ENCOUNTER — Other Ambulatory Visit: Payer: Self-pay | Admitting: Family Medicine

## 2022-03-06 ENCOUNTER — Other Ambulatory Visit: Payer: Self-pay | Admitting: Family Medicine

## 2022-09-10 ENCOUNTER — Other Ambulatory Visit: Payer: Self-pay | Admitting: Family Medicine
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
